# Patient Record
Sex: Male | Born: 1950 | Race: White | Hispanic: No | Marital: Married | Smoking: Never smoker
Health system: Southern US, Community
[De-identification: ages and names within clinical notes are randomized; demographics above are authoritative.]

## PROBLEM LIST (undated history)

## (undated) DIAGNOSIS — M1990 Unspecified osteoarthritis, unspecified site: Secondary | ICD-10-CM

## (undated) DIAGNOSIS — I10 Essential (primary) hypertension: Secondary | ICD-10-CM

## (undated) DIAGNOSIS — K219 Gastro-esophageal reflux disease without esophagitis: Secondary | ICD-10-CM

## (undated) DIAGNOSIS — F329 Major depressive disorder, single episode, unspecified: Secondary | ICD-10-CM

## (undated) DIAGNOSIS — F32A Depression, unspecified: Secondary | ICD-10-CM

## (undated) DIAGNOSIS — F419 Anxiety disorder, unspecified: Secondary | ICD-10-CM

## (undated) DIAGNOSIS — T4145XA Adverse effect of unspecified anesthetic, initial encounter: Secondary | ICD-10-CM

## (undated) DIAGNOSIS — Z8489 Family history of other specified conditions: Secondary | ICD-10-CM

## (undated) DIAGNOSIS — J189 Pneumonia, unspecified organism: Secondary | ICD-10-CM

## (undated) DIAGNOSIS — T8859XA Other complications of anesthesia, initial encounter: Secondary | ICD-10-CM

## (undated) DIAGNOSIS — E119 Type 2 diabetes mellitus without complications: Secondary | ICD-10-CM

## (undated) HISTORY — PX: JOINT REPLACEMENT: SHX530

## (undated) HISTORY — PX: ROTATOR CUFF REPAIR: SHX139

## (undated) HISTORY — PX: HERNIA REPAIR: SHX51

---

## 2018-01-15 NOTE — Patient Instructions (Signed)
Javan Gonzaga  01/15/2018   Your procedure is scheduled on: Tuesday 01/23/2018  Report to [facility] Main  Entrance              Report to admitting at  0735  AM    Call this number if you have problems the morning of surgery [phone]     Remember: Do not eat food or drink liquids :After Midnight.  How to Manage Your Diabetes Before and After Surgery  Why is it important to control my blood sugar before and after surgery? . Improving blood sugar levels before and after surgery helps healing and can limit problems. . A way of improving blood sugar control is eating a healthy diet by: o  Eating less sugar and carbohydrates o  Increasing activity/exercise o  Talking with your doctor about reaching your blood sugar goals . High blood sugars (greater than 180 mg/dL) can raise your risk of infections and slow your recovery, so you will need to focus on controlling your diabetes during the weeks before surgery. . Make sure that the doctor who takes care of your diabetes knows about your planned surgery including the date and location.  How do I manage my blood sugar before surgery? . Check your blood sugar at least 4 times a day, starting 2 days before surgery, to make sure that the level is not too high or low. o Check your blood sugar the morning of your surgery when you wake up and every 2 hours until you get to the Short Stay unit. . If your blood sugar is less than 70 mg/dL, you will need to treat for low blood sugar: o Do not take insulin. o Treat a low blood sugar (less than 70 mg/dL) with  cup of clear juice (cranberry or apple), 4 glucose tablets, OR glucose gel. o Recheck blood sugar in 15 minutes after treatment (to make sure it is greater than 70 mg/dL). If your blood sugar is not greater than 70 mg/dL on recheck, call [phone] for further instructions. . Report your blood sugar to the short stay nurse when you get to Short Stay.  . If you are  admitted to the hospital after surgery: o Your blood sugar will be checked by the staff and you will probably be given insulin after surgery (instead of oral diabetes medicines) to make sure you have good blood sugar levels. o The goal for blood sugar control after surgery is 80-180 mg/dL.   WHAT DO I DO ABOUT MY DIABETES MEDICATION?       The day before surgery, take the Glimepiride (Amaryl) morning dose only!  . Do not take oral diabetes medicines (pills) the morning of surgery.     Take these medicines the morning of surgery with A SIP OF WATER: Citalopram (Celexa)   DO NOT TAKE ANY DIABETIC MEDICATIONS DAY OF YOUR SURGERY                               You may not have any metal on your body including hair pins and              piercings  Do not wear jewelry, make-up, lotions, powders or perfumes, deodorant                        Men  may shave face and neck.   Do not bring valuables to the hospital. [facility] IS NOT             RESPONSIBLE   FOR VALUABLES.  Contacts, dentures or bridgework may not be worn into surgery.  Leave suitcase in the car. After surgery it may be brought to your room.                  Please read over the following fact sheets you were given: _____________________________________________________________________             [facility] - Preparing for Surgery Before surgery, you can play an important role.  Because skin is not sterile, your skin needs to be as free of germs as possible.  You can reduce the number of germs on your skin by washing with CHG (chlorahexidine gluconate) soap before surgery.  CHG is an antiseptic cleaner which kills germs and bonds with the skin to continue killing germs even after washing. Please DO NOT use if you have an allergy to CHG or antibacterial soaps.  If your skin becomes reddened/irritated stop using the CHG and inform your nurse when you arrive at Short Stay. Do not shave (including legs and underarms) for at  least 48 hours prior to the first CHG shower.  You may shave your face/neck. Please follow these instructions carefully:  1.  Shower with CHG Soap the night before surgery and the  morning of Surgery.  2.  If you choose to wash your hair, wash your hair first as usual with your  normal  shampoo.  3.  After you shampoo, rinse your hair and body thoroughly to remove the  shampoo.                           4.  Use CHG as you would any other liquid soap.  You can apply chg directly  to the skin and wash                       Gently with a scrungie or clean washcloth.  5.  Apply the CHG Soap to your body ONLY FROM THE NECK DOWN.   Do not use on face/ open                           Wound or open sores. Avoid contact with eyes, ears mouth and genitals (private parts).                       Wash face,  Genitals (private parts) with your normal soap.             6.  Wash thoroughly, paying special attention to the area where your surgery  will be performed.  7.  Thoroughly rinse your body with warm water from the neck down.  8.  DO NOT shower/wash with your normal soap after using and rinsing off  the CHG Soap.                9.  Pat yourself dry with a clean towel.            10.  Wear clean pajamas.            11.  Place clean sheets on your bed the night of your first shower and do not  sleep with pets.  Day of Surgery : Do not apply any lotions/deodorants the morning of surgery.  Please wear clean clothes to the hospital/surgery center.  FAILURE TO FOLLOW THESE INSTRUCTIONS MAY RESULT IN THE CANCELLATION OF YOUR SURGERY PATIENT SIGNATURE_________________________________  NURSE SIGNATURE__________________________________  ________________________________________________________________________   Rogelia Mire  An incentive spirometer is a tool that can help keep your lungs clear and active. This tool measures how well you are filling your lungs with each breath. Taking long deep breaths  may help reverse or decrease the chance of developing breathing (pulmonary) problems (especially infection) following:  A long period of time when you are unable to move or be active. BEFORE THE PROCEDURE   If the spirometer includes an indicator to show your best effort, your nurse or respiratory therapist will set it to a desired goal.  If possible, sit up straight or lean slightly forward. Try not to slouch.  Hold the incentive spirometer in an upright position. INSTRUCTIONS FOR USE  1. Sit on the edge of your bed if possible, or sit up as far as you can in bed or on a chair. 2. Hold the incentive spirometer in an upright position. 3. Breathe out normally. 4. Place the mouthpiece in your mouth and seal your lips tightly around it. 5. Breathe in slowly and as deeply as possible, raising the piston or the ball toward the top of the column. 6. Hold your breath for 3-5 seconds or for as long as possible. Allow the piston or ball to fall to the bottom of the column. 7. Remove the mouthpiece from your mouth and breathe out normally. 8. Rest for a few seconds and repeat Steps 1 through 7 at least 10 times every 1-2 hours when you are awake. Take your time and take a few normal breaths between deep breaths. 9. The spirometer may include an indicator to show your best effort. Use the indicator as a goal to work toward during each repetition. 10. After each set of 10 deep breaths, practice coughing to be sure your lungs are clear. If you have an incision (the cut made at the time of surgery), support your incision when coughing by placing a pillow or rolled up towels firmly against it. Once you are able to get out of bed, walk around indoors and cough well. You may stop using the incentive spirometer when instructed by your caregiver.  RISKS AND COMPLICATIONS  Take your time so you do not get dizzy or light-headed.  If you are in pain, you may need to take or ask for pain medication before doing  incentive spirometry. It is harder to take a deep breath if you are having pain. AFTER USE  Rest and breathe slowly and easily.  It can be helpful to keep track of a log of your progress. Your caregiver can provide you with a simple table to help with this. If you are using the spirometer at home, follow these instructions: SEEK MEDICAL CARE IF:   You are having difficultly using the spirometer.  You have trouble using the spirometer as often as instructed.  Your pain medication is not giving enough relief while using the spirometer.  You develop fever of 100.5 F (38.1 C) or higher. SEEK IMMEDIATE MEDICAL CARE IF:   You cough up bloody sputum that had not been present before.  You develop fever of 102 F (38.9 C) or greater.  You develop worsening pain at or near the incision site. MAKE SURE YOU:   Understand these instructions.  Will watch your condition.  Will get help right away if you are not doing well or get worse. Document Released: 01/09/2007 Document Revised: 11/21/2011 Document Reviewed: 03/12/2007 [employer] Patient Information 2014 [employer], [place].   ________________________________________________________________________  WHAT IS A BLOOD TRANSFUSION? Blood Transfusion Information  A transfusion is the replacement of blood or some of its parts. Blood is made up of multiple cells which provide different functions.  Red blood cells carry oxygen and are used for blood loss replacement.  White blood cells fight against infection.  Platelets control bleeding.  Plasma helps clot blood.  Other blood products are available for specialized needs, such as hemophilia or other clotting disorders. BEFORE THE TRANSFUSION  Who gives blood for transfusions?   Healthy volunteers who are fully evaluated to make sure their blood is safe. This is blood bank blood. Transfusion therapy is the safest it has ever been in the practice of medicine. Before blood is taken from a  donor, a complete history is taken to make sure that person has no history of diseases nor engages in risky social behavior (examples are intravenous drug use or sexual activity with multiple partners). The donor's travel history is screened to minimize risk of transmitting infections, such as malaria. The donated blood is tested for signs of infectious diseases, such as HIV and hepatitis. The blood is then tested to be sure it is compatible with you in order to minimize the chance of a transfusion reaction. If you or a relative donates blood, this is often done in anticipation of surgery and is not appropriate for emergency situations. It takes many days to process the donated blood. RISKS AND COMPLICATIONS Although transfusion therapy is very safe and saves many lives, the main dangers of transfusion include:   Getting an infectious disease.  Developing a transfusion reaction. This is an allergic reaction to something in the blood you were given. Every precaution is taken to prevent this. The decision to have a blood transfusion has been considered carefully by your caregiver before blood is given. Blood is not given unless the benefits outweigh the risks. AFTER THE TRANSFUSION  Right after receiving a blood transfusion, you will usually feel much better and more energetic. This is especially true if your red blood cells have gotten low (anemic). The transfusion raises the level of the red blood cells which carry oxygen, and this usually causes an energy increase.  The nurse administering the transfusion will monitor you carefully for complications. HOME CARE INSTRUCTIONS  No special instructions are needed after a transfusion. You may find your energy is better. Speak with your caregiver about any limitations on activity for underlying diseases you may have. SEEK MEDICAL CARE IF:   Your condition is not improving after your transfusion.  You develop redness or irritation at the intravenous (IV)  site. SEEK IMMEDIATE MEDICAL CARE IF:  Any of the following symptoms occur over the next 12 hours:  Shaking chills.  You have a temperature by mouth above 102 F (38.9 C), not controlled by medicine.  Chest, back, or muscle pain.  People around you feel you are not acting correctly or are confused.  Shortness of breath or difficulty breathing.  Dizziness and fainting.  You get a rash or develop hives.  You have a decrease in urine output.  Your urine turns a dark color or changes to pink, red, or brown. Any of the following symptoms occur over the next 10 days:  You have a temperature by mouth above 102  F (38.9 C), not controlled by medicine.  Shortness of breath.  Weakness after normal activity.  The white part of the eye turns yellow (jaundice).  You have a decrease in the amount of urine or are urinating less often.  Your urine turns a dark color or changes to pink, red, or brown. Document Released: 08/26/2000 Document Revised: 11/21/2011 Document Reviewed: 04/14/2008 [facility] Patient Information 2014 [place], [place].  _______________________________________________________________________

## 2018-01-17 ENCOUNTER — Inpatient Hospital Stay (HOSPITAL_COMMUNITY)
Admission: RE | Admit: 2018-01-17 | Discharge: 2018-01-17 | Disposition: A | Discharge status: Home / Self Care | Payer: Self-pay | Source: Ambulatory Visit

## 2018-01-23 ENCOUNTER — Encounter (HOSPITAL_COMMUNITY): Admission: RE | Payer: Self-pay | Source: Ambulatory Visit

## 2018-01-23 ENCOUNTER — Inpatient Hospital Stay (HOSPITAL_COMMUNITY): Admission: RE | Admit: 2018-01-23 | Payer: Medicare Other | Source: Ambulatory Visit | Admitting: Orthopedic Surgery

## 2018-01-23 SURGERY — ARTHROPLASTY, HIP, TOTAL, ANTERIOR APPROACH
Anesthesia: Spinal | Site: Hip | Laterality: Right

## 2018-04-06 NOTE — H&P (Signed)
TOTAL HIP ADMISSION H&P  Patient is admitted for right total hip arthroplasty, anterior approach.  Subjective:  Chief Complaint:    Right hip primary OA / pain  HPI: Jose Yang, 67 y.o. male, has a history of pain and functional disability in the right hip(s) due to arthritis and patient has failed non-surgical conservative treatments for greater than 12 weeks to include NSAID's and/or analgesics, corticosteriod injections and activity modification.  Onset of symptoms was gradual starting 3 years ago with gradually worsening course since that time.The patient noted no past surgery on the right hip(s).  Patient currently rates pain in the right hip at 10 out of 10 with activity. Patient has night pain, worsening of pain with activity and weight bearing, trendelenberg gait, pain that interfers with activities of daily living and pain with passive range of motion. Patient has evidence of periarticular osteophytes and joint space narrowing by imaging studies. This condition presents safety issues increasing the risk of falls.  There is no current active infection.  Risks, benefits and expectations were discussed with the patient.  Risks including but not limited to the risk of anesthesia, blood clots, nerve damage, blood vessel damage, failure of the prosthesis, infection and up to and including death.  Patient understand the risks, benefits and expectations and wishes to proceed with surgery.   PCP: System, Pcp Not In  D/C Plans:       Home  Post-op Meds:       No Rx given  Tranexamic Acid:      To be given - IV  Decadron:      Is to be given  FYI:      ASA  [employer]co  DME:   Pt already has equipment   PT:   No PT     Past Medical History:  Diagnosis Date  . Anxiety   . Arthritis   . Complication of anesthesia    confusion for a couple weeks after anesthesia  . Depression   . Diabetes mellitus without complication (HCC)    type 2  . Family history of adverse reaction to anesthesia    . GERD (gastroesophageal reflux disease)   . Hypertension   . Pneumonia    as a   small child     Past Surgical History:  Procedure Laterality Date  . HERNIA REPAIR     bil groin in 1970's  . JOINT REPLACEMENT     04-17-18 Dr Charlann Boxerlin right hip  . ROTATOR CUFF REPAIR     right    No current facility-administered medications for this encounter.    Current Outpatient Medications  Medication Sig Dispense Refill Last Dose  . benazepril (LOTENSIN) 40 MG tablet Take 40 mg by mouth daily with breakfast.     . citalopram (CELEXA) 40 MG tablet Take 40 mg by mouth daily with breakfast.     . dexlansoprazole (DEXILANT) 60 MG capsule Take 60 mg by mouth daily before breakfast.     . glimepiride (AMARYL) 4 MG tablet Take 2 mg by mouth 2 (two) times daily. Breakfast & supper     . ibuprofen (ADVIL,MOTRIN) 200 MG tablet Take 800 mg by mouth every 8 (eight) hours as needed (for pain.).     Marland Kitchen. metFORMIN (GLUCOPHAGE) 1000 MG tablet Take 1,000 mg by mouth 2 (two) times daily.     . rosuvastatin (CRESTOR) 40 MG tablet Take 40 mg by mouth at bedtime.     . vitamin B-12 (CYANOCOBALAMIN) 1000 MCG tablet  Take 1,000 mcg by mouth 2 (two) times a week.      No Known Allergies   Social History   Tobacco Use  . Smoking status: Never Smoker  . Smokeless tobacco: Never Used  Substance Use Topics  . Alcohol use: Not Currently    Frequency: Never       Review of Systems  Constitutional: Negative.   HENT: Negative.   Eyes: Negative.   Respiratory: Positive for shortness of breath (with exertion).   Cardiovascular: Negative.   Gastrointestinal: Positive for diarrhea, heartburn and nausea.  Genitourinary: Positive for frequency.  Musculoskeletal: Positive for back pain and joint pain.  Skin: Negative.   Neurological: Positive for tremors and headaches.  Endo/Heme/Allergies: Negative.   Psychiatric/Behavioral: The patient is nervous/anxious.     Objective:  Physical Exam  Constitutional: He is  oriented to person, place, and time. He appears well-developed.  HENT:  Head: Normocephalic.  Eyes: Pupils are equal, round, and reactive to light.  Neck: Neck supple. No JVD present. No tracheal deviation present. No thyromegaly present.  Cardiovascular: Normal rate, regular rhythm and intact distal pulses.  Respiratory: Effort normal and breath sounds normal. No respiratory distress. He has no wheezes.  GI: Soft. There is no tenderness. There is no guarding.  Musculoskeletal:       Right hip: He exhibits decreased range of motion, decreased strength, tenderness and bony tenderness. He exhibits no swelling, no deformity and no laceration.  Lymphadenopathy:    He has no cervical adenopathy.  Neurological: He is alert and oriented to person, place, and time.  Skin: Skin is warm and dry.  Psychiatric: He has a normal mood and affect.       Imaging Review Plain radiographs demonstrate severe degenerative joint disease of the right hip. The bone quality appears to be good for age and reported activity level.    Preoperative templating of the joint replacement has been completed, documented, and submitted to the Operating Room personnel in order to optimize intra-operative equipment management.     Assessment/Plan:  End stage arthritis, right hip  The patient history, physical examination, clinical judgement of the provider and imaging studies are consistent with end stage degenerative joint disease of the right hip and total hip arthroplasty is deemed medically necessary. The treatment options including medical management, injection therapy, arthroscopy and arthroplasty were discussed at length. The risks and benefits of total hip arthroplasty were presented and reviewed. The risks due to aseptic loosening, infection, stiffness, dislocation/subluxation,  thromboembolic complications and other imponderables were discussed.  The patient acknowledged the explanation, agreed to proceed with  the plan and consent was signed. Patient is being admitted for inpatient treatment for surgery, pain control, PT, OT, prophylactic antibiotics, VTE prophylaxis, progressive ambulation and ADL's and discharge planning.The patient is planning to be discharged home.    Anastasio Auerbach Robi Mitter   PA-C  04/13/2018, 8:56 AM

## 2018-04-10 ENCOUNTER — Other Ambulatory Visit (HOSPITAL_COMMUNITY): Payer: Self-pay | Admitting: *Deleted

## 2018-04-10 NOTE — Progress Notes (Signed)
MEDICAL CLEARANCE NOTE CASEY SHARP FNP  04-06-18 ON CHART FOR 04-17-18 SURGERY CBC WITH DIF, CMET 04-05-18 [facility] [place] ON CHART HEMAGLOBIN A1C 03-08-18 [facility] [place] ON CHART LOV CASEY SHARP FNO [facility] [place] 03-08-18 ON CHART EKG 04-05-18 [facility] [place] ON CHART

## 2018-04-10 NOTE — Patient Instructions (Addendum)
Jose Yang  04/10/2018   Your procedure is scheduled on: 04-17-18  Report to [facility] Main  Entrance  Report to admitting at 900 AM    Call this number if you have problems the morning of surgery [phone]   Remember: Do not eat food or drink liquids :After Midnight.                 TAKE ONLY YOUR MORNING DOSE OF GLIMPERIDE DAY BEFORE SURGERY 04-16-18               TAKE YOUR METFORMIN AS USUAL DAY BEFORE SURGERY 04-16-18               DO NOT TAKE ANY DIABETIC MEDICATIONS DAY OF SURGERY 04-17-18  Take these medicines the morning of surgery with A SIP OF WATER: CITALOPRAM (CELEXA), DEXILANT                               You may not have any metal on your body including hair pins and              piercings  Do not wear jewelry,  lotions, powders or perfumes, deodorant                    Men may shave face and neck.   Do not bring valuables to the hospital. [facility] IS NOT             RESPONSIBLE   FOR VALUABLES.  Contacts, dentures or bridgework may not be worn into surgery.  Leave suitcase in the car. After surgery it may be brought to your room.               Please read over the following fact sheets you were given: _____________________________________________________________________           [facility] - Preparing for Surgery Before surgery, you can play an important role.  Because skin is not sterile, your skin needs to be as free of germs as possible.  You can reduce the number of germs on your skin by washing with CHG (chlorahexidine gluconate) soap before surgery.  CHG is an antiseptic cleaner which kills germs and bonds with the skin to continue killing germs even after washing. Please DO NOT use if you have an allergy to CHG or antibacterial soaps.  If your skin becomes reddened/irritated stop using the CHG and inform your nurse when you arrive at Short Stay. Do not shave (including legs and underarms) for at least 48 hours prior to the first CHG  shower.  You may shave your face/neck. Please follow these instructions carefully:  1.  Shower with CHG Soap the night before surgery and the  morning of Surgery.  2.  If you choose to wash your hair, wash your hair first as usual with your  normal  shampoo.  3.  After you shampoo, rinse your hair and body thoroughly to remove the  shampoo.                           4.  Use CHG as you would any other liquid soap.  You can apply chg directly  to the skin and wash  Gently with a scrungie or clean washcloth.  5.  Apply the CHG Soap to your body ONLY FROM THE NECK DOWN.   Do not use on face/ open                           Wound or open sores. Avoid contact with eyes, ears mouth and genitals (private parts).                       Wash face,  Genitals (private parts) with your normal soap.             6.  Wash thoroughly, paying special attention to the area where your surgery  will be performed.  7.  Thoroughly rinse your body with warm water from the neck down.  8.  DO NOT shower/wash with your normal soap after using and rinsing off  the CHG Soap.                9.  Pat yourself dry with a clean towel.            10.  Wear clean pajamas.            11.  Place clean sheets on your bed the night of your first shower and do not  sleep with pets. Day of Surgery : Do not apply any lotions/deodorants the morning of surgery.  Please wear clean clothes to the hospital/surgery center.  FAILURE TO FOLLOW THESE INSTRUCTIONS MAY RESULT IN THE CANCELLATION OF YOUR SURGERY PATIENT SIGNATURE_________________________________  NURSE SIGNATURE__________________________________  ________________________________________________________________________   Jose Yang  An incentive spirometer is a tool that can help keep your lungs clear and active. This tool measures how well you are filling your lungs with each breath. Taking long deep breaths may help reverse or decrease the chance  of developing breathing (pulmonary) problems (especially infection) following:  A long period of time when you are unable to move or be active. BEFORE THE PROCEDURE   If the spirometer includes an indicator to show your best effort, your nurse or respiratory therapist will set it to a desired goal.  If possible, sit up straight or lean slightly forward. Try not to slouch.  Hold the incentive spirometer in an upright position. INSTRUCTIONS FOR USE  1. Sit on the edge of your bed if possible, or sit up as far as you can in bed or on a chair. 2. Hold the incentive spirometer in an upright position. 3. Breathe out normally. 4. Place the mouthpiece in your mouth and seal your lips tightly around it. 5. Breathe in slowly and as deeply as possible, raising the piston or the ball toward the top of the column. 6. Hold your breath for 3-5 seconds or for as long as possible. Allow the piston or ball to fall to the bottom of the column. 7. Remove the mouthpiece from your mouth and breathe out normally. 8. Rest for a few seconds and repeat Steps 1 through 7 at least 10 times every 1-2 hours when you are awake. Take your time and take a few normal breaths between deep breaths. 9. The spirometer may include an indicator to show your best effort. Use the indicator as a goal to work toward during each repetition. 10. After each set of 10 deep breaths, practice coughing to be sure your lungs are clear. If you have an incision (the cut made at the time of surgery),  support your incision when coughing by placing a pillow or rolled up towels firmly against it. Once you are able to get out of bed, walk around indoors and cough well. You may stop using the incentive spirometer when instructed by your caregiver.  RISKS AND COMPLICATIONS  Take your time so you do not get dizzy or light-headed.  If you are in pain, you may need to take or ask for pain medication before doing incentive spirometry. It is harder to  take a deep breath if you are having pain. AFTER USE  Rest and breathe slowly and easily.  It can be helpful to keep track of a log of your progress. Your caregiver can provide you with a simple table to help with this. If you are using the spirometer at home, follow these instructions: [place] IF:   You are having difficultly using the spirometer.  You have trouble using the spirometer as often as instructed.  Your pain medication is not giving enough relief while using the spirometer.  You develop fever of 100.5 F (38.1 C) or higher. SEEK IMMEDIATE MEDICAL CARE IF:   You cough up bloody sputum that had not been present before.  You develop fever of 102 F (38.9 C) or greater.  You develop worsening pain at or near the incision site. MAKE SURE YOU:   Understand these instructions.  Will watch your condition.  Will get help right away if you are not doing well or get worse. Document Released: 01/09/2007 Document Revised: 11/21/2011 Document Reviewed: 03/12/2007 ExitCare Patient Information 2014 ExitCare, [place].   ________________________________________________________________________  WHAT IS A BLOOD TRANSFUSION? Blood Transfusion Information  A transfusion is the replacement of blood or some of its parts. Blood is made up of multiple cells which provide different functions.  Red blood cells carry oxygen and are used for blood loss replacement.  White blood cells fight against infection.  Platelets control bleeding.  Plasma helps clot blood.  Other blood products are available for specialized needs, such as hemophilia or other clotting disorders. BEFORE THE TRANSFUSION  Who gives blood for transfusions?   Healthy volunteers who are fully evaluated to make sure their blood is safe. This is blood bank blood. Transfusion therapy is the safest it has ever been in the practice of medicine. Before blood is taken from a donor, a complete history is taken to  make sure that person has no history of diseases nor engages in risky social behavior (examples are intravenous drug use or sexual activity with multiple partners). The donor's travel history is screened to minimize risk of transmitting infections, such as malaria. The donated blood is tested for signs of infectious diseases, such as HIV and hepatitis. The blood is then tested to be sure it is compatible with you in order to minimize the chance of a transfusion reaction. If you or a relative donates blood, this is often done in anticipation of surgery and is not appropriate for emergency situations. It takes many days to process the donated blood. RISKS AND COMPLICATIONS Although transfusion therapy is very safe and saves many lives, the main dangers of transfusion include:   Getting an infectious disease.  Developing a transfusion reaction. This is an allergic reaction to something in the blood you were given. Every precaution is taken to prevent this. The decision to have a blood transfusion has been considered carefully by your caregiver before blood is given. Blood is not given unless the benefits outweigh the risks. AFTER THE TRANSFUSION  Right after receiving a blood transfusion, you will usually feel much better and more energetic. This is especially true if your red blood cells have gotten low (anemic). The transfusion raises the level of the red blood cells which carry oxygen, and this usually causes an energy increase.  The nurse administering the transfusion will monitor you carefully for complications. HOME CARE INSTRUCTIONS  No special instructions are needed after a transfusion. You may find your energy is better. Speak with your caregiver about any limitations on activity for underlying diseases you may have. SEEK MEDICAL CARE IF:   Your condition is not improving after your transfusion.  You develop redness or irritation at the intravenous (IV) site. SEEK IMMEDIATE MEDICAL CARE  IF:  Any of the following symptoms occur over the next 12 hours:  Shaking chills.  You have a temperature by mouth above 102 F (38.9 C), not controlled by medicine.  Chest, back, or muscle pain.  People around you feel you are not acting correctly or are confused.  Shortness of breath or difficulty breathing.  Dizziness and fainting.  You get a rash or develop hives.  You have a decrease in urine output.  Your urine turns a dark color or changes to pink, red, or brown. Any of the following symptoms occur over the next 10 days:  You have a temperature by mouth above 102 F (38.9 C), not controlled by medicine.  Shortness of breath.  Weakness after normal activity.  The white part of the eye turns yellow (jaundice).  You have a decrease in the amount of urine or are urinating less often.  Your urine turns a dark color or changes to pink, red, or brown. Document Released: 08/26/2000 Document Revised: 11/21/2011 Document Reviewed: 04/14/2008 [facility] Patient Information 2014 [place], [place].  _______________________________________________________________________

## 2018-04-11 ENCOUNTER — Encounter (HOSPITAL_COMMUNITY): Payer: Self-pay

## 2018-04-11 ENCOUNTER — Other Ambulatory Visit: Payer: Self-pay

## 2018-04-11 ENCOUNTER — Encounter (HOSPITAL_COMMUNITY)
Admission: RE | Admit: 2018-04-11 | Discharge: 2018-04-11 | Disposition: A | Discharge status: Home / Self Care | Payer: Medicare Other | Source: Ambulatory Visit | Attending: Orthopedic Surgery | Admitting: Orthopedic Surgery

## 2018-04-11 DIAGNOSIS — Z01812 Encounter for preprocedural laboratory examination: Secondary | ICD-10-CM | POA: Insufficient documentation

## 2018-04-11 DIAGNOSIS — M1611 Unilateral primary osteoarthritis, right hip: Secondary | ICD-10-CM | POA: Diagnosis not present

## 2018-04-11 DIAGNOSIS — M25551 Pain in right hip: Secondary | ICD-10-CM | POA: Insufficient documentation

## 2018-04-11 HISTORY — DX: Major depressive disorder, single episode, unspecified: F32.9

## 2018-04-11 HISTORY — DX: Type 2 diabetes mellitus without complications: E11.9

## 2018-04-11 HISTORY — DX: Essential (primary) hypertension: I10

## 2018-04-11 HISTORY — DX: Gastro-esophageal reflux disease without esophagitis: K21.9

## 2018-04-11 HISTORY — DX: Unspecified osteoarthritis, unspecified site: M19.90

## 2018-04-11 HISTORY — DX: Other complications of anesthesia, initial encounter: T88.59XA

## 2018-04-11 HISTORY — DX: Pneumonia, unspecified organism: J18.9

## 2018-04-11 HISTORY — DX: Family history of other specified conditions: Z84.89

## 2018-04-11 HISTORY — DX: Depression, unspecified: F32.A

## 2018-04-11 HISTORY — DX: Anxiety disorder, unspecified: F41.9

## 2018-04-11 HISTORY — DX: Adverse effect of unspecified anesthetic, initial encounter: T41.45XA

## 2018-04-11 LAB — SURGICAL PCR SCREEN
MRSA, PCR: NEGATIVE
Staphylococcus aureus: POSITIVE — AB

## 2018-04-11 LAB — ABO/RH: ABO/RH(D): A POS

## 2018-04-12 ENCOUNTER — Other Ambulatory Visit: Payer: Self-pay | Admitting: Orthopedic Surgery

## 2018-04-12 NOTE — Care Plan (Signed)
R THA scheduled on 04-17-18 DCP:  Home with spouse.  1 story home with 1 ste. DME:  No needs.  Has a RW and elevated toilets. PT:  HEP

## 2018-04-17 ENCOUNTER — Other Ambulatory Visit: Payer: Self-pay

## 2018-04-17 ENCOUNTER — Inpatient Hospital Stay (HOSPITAL_COMMUNITY): Payer: Medicare Other | Admitting: Certified Registered"

## 2018-04-17 ENCOUNTER — Encounter (HOSPITAL_COMMUNITY)
Admission: RE | Disposition: A | Discharge status: Home / Self Care | Payer: Self-pay | Source: Ambulatory Visit | Attending: Orthopedic Surgery

## 2018-04-17 ENCOUNTER — Encounter (HOSPITAL_COMMUNITY): Payer: Self-pay | Admitting: *Deleted

## 2018-04-17 ENCOUNTER — Inpatient Hospital Stay (HOSPITAL_COMMUNITY)
Admission: RE | Admit: 2018-04-17 | Discharge: 2018-04-18 | DRG: 470 | Disposition: A | Discharge status: Home / Self Care | Payer: Medicare Other | Source: Ambulatory Visit | Attending: Orthopedic Surgery | Admitting: Orthopedic Surgery

## 2018-04-17 ENCOUNTER — Inpatient Hospital Stay (HOSPITAL_COMMUNITY): Payer: Medicare Other

## 2018-04-17 DIAGNOSIS — F419 Anxiety disorder, unspecified: Secondary | ICD-10-CM | POA: Diagnosis present

## 2018-04-17 DIAGNOSIS — M1611 Unilateral primary osteoarthritis, right hip: Principal | ICD-10-CM | POA: Diagnosis present

## 2018-04-17 DIAGNOSIS — F329 Major depressive disorder, single episode, unspecified: Secondary | ICD-10-CM | POA: Diagnosis present

## 2018-04-17 DIAGNOSIS — Z6832 Body mass index (BMI) 32.0-32.9, adult: Secondary | ICD-10-CM

## 2018-04-17 DIAGNOSIS — I10 Essential (primary) hypertension: Secondary | ICD-10-CM | POA: Diagnosis present

## 2018-04-17 DIAGNOSIS — Z9181 History of falling: Secondary | ICD-10-CM | POA: Diagnosis not present

## 2018-04-17 DIAGNOSIS — E669 Obesity, unspecified: Secondary | ICD-10-CM | POA: Diagnosis present

## 2018-04-17 DIAGNOSIS — Z7984 Long term (current) use of oral hypoglycemic drugs: Secondary | ICD-10-CM

## 2018-04-17 DIAGNOSIS — Z79899 Other long term (current) drug therapy: Secondary | ICD-10-CM | POA: Diagnosis not present

## 2018-04-17 DIAGNOSIS — E119 Type 2 diabetes mellitus without complications: Secondary | ICD-10-CM | POA: Diagnosis present

## 2018-04-17 DIAGNOSIS — Z96649 Presence of unspecified artificial hip joint: Secondary | ICD-10-CM

## 2018-04-17 HISTORY — PX: TOTAL HIP ARTHROPLASTY: SHX124

## 2018-04-17 LAB — GLUCOSE, CAPILLARY
GLUCOSE-CAPILLARY: 225 mg/dL — AB (ref 70–99)
Glucose-Capillary: 196 mg/dL — ABNORMAL HIGH (ref 70–99)
Glucose-Capillary: 245 mg/dL — ABNORMAL HIGH (ref 70–99)
Glucose-Capillary: 203 mg/dL — ABNORMAL HIGH (ref 70–99)

## 2018-04-17 LAB — TYPE AND SCREEN
ABO/RH(D): A POS
Antibody Screen: NEGATIVE

## 2018-04-17 SURGERY — ARTHROPLASTY, HIP, TOTAL, ANTERIOR APPROACH
Anesthesia: Spinal | Site: Hip | Laterality: Right

## 2018-04-17 MED ORDER — DIPHENHYDRAMINE HCL 12.5 MG/5ML PO ELIX
12.5000 mg | ORAL_SOLUTION | ORAL | Status: DC | PRN
  Administered 2018-04-17: 25 mg via ORAL
  Filled 2018-04-17: qty 10

## 2018-04-17 MED ORDER — ALUM & MAG HYDROXIDE-SIMETH 200-200-20 MG/5ML PO SUSP: 15.0000 mL | ORAL | Status: DC | PRN

## 2018-04-17 MED ORDER — OXYCODONE HCL 5 MG PO TABS: 5.0000 mg | ORAL_TABLET | Freq: Once | ORAL | Status: DC | PRN

## 2018-04-17 MED ORDER — CHLORHEXIDINE GLUCONATE 4 % EX LIQD: 60.0000 mL | Freq: Once | CUTANEOUS | Status: DC

## 2018-04-17 MED ORDER — MAGNESIUM CITRATE PO SOLN: 1.0000 | Freq: Once | ORAL | Status: DC | PRN

## 2018-04-17 MED ORDER — ROSUVASTATIN CALCIUM 20 MG PO TABS
40.0000 mg | ORAL_TABLET | Freq: Every day | ORAL | Status: DC
  Administered 2018-04-17: 40 mg via ORAL
  Filled 2018-04-17: qty 2

## 2018-04-17 MED ORDER — SODIUM CHLORIDE 0.9 % IR SOLN
Status: DC | PRN
  Administered 2018-04-17: 1000 mL

## 2018-04-17 MED ORDER — CEFAZOLIN SODIUM-DEXTROSE 2-4 GM/100ML-% IV SOLN
2.0000 g | INTRAVENOUS | Status: AC
  Administered 2018-04-17: 2 g via INTRAVENOUS
  Filled 2018-04-17: qty 100

## 2018-04-17 MED ORDER — BUPIVACAINE IN DEXTROSE 0.75-8.25 % IT SOLN
INTRATHECAL | Status: DC | PRN
  Administered 2018-04-17: 2 mL via INTRATHECAL

## 2018-04-17 MED ORDER — TRANEXAMIC ACID 1000 MG/10ML IV SOLN
1000.0000 mg | INTRAVENOUS | Status: AC
  Administered 2018-04-17: 1000 mg via INTRAVENOUS
  Filled 2018-04-17: qty 10

## 2018-04-17 MED ORDER — FERROUS SULFATE 325 (65 FE) MG PO TABS
325.0000 mg | ORAL_TABLET | Freq: Three times a day (TID) | ORAL | Status: DC
  Administered 2018-04-18 (×2): 325 mg via ORAL
  Filled 2018-04-17 (×2): qty 1

## 2018-04-17 MED ORDER — DOCUSATE SODIUM 100 MG PO CAPS: 100.0000 mg | ORAL_CAPSULE | Freq: Two times a day (BID) | ORAL | 0 refills | Status: AC

## 2018-04-17 MED ORDER — MIDAZOLAM HCL 2 MG/2ML IJ SOLN
INTRAMUSCULAR | Status: DC | PRN
  Administered 2018-04-17: 2 mg via INTRAVENOUS

## 2018-04-17 MED ORDER — PANTOPRAZOLE SODIUM 40 MG PO TBEC
80.0000 mg | DELAYED_RELEASE_TABLET | Freq: Every day | ORAL | Status: DC
  Administered 2018-04-18: 80 mg via ORAL
  Filled 2018-04-17: qty 2

## 2018-04-17 MED ORDER — METHOCARBAMOL 500 MG IVPB - SIMPLE MED
500.0000 mg | Freq: Four times a day (QID) | INTRAVENOUS | Status: DC | PRN
  Administered 2018-04-17 (×2): 500 mg via INTRAVENOUS
  Filled 2018-04-17: qty 500

## 2018-04-17 MED ORDER — METOCLOPRAMIDE HCL 5 MG PO TABS: 5.0000 mg | ORAL_TABLET | Freq: Three times a day (TID) | ORAL | Status: DC | PRN

## 2018-04-17 MED ORDER — OXYCODONE HCL 5 MG/5ML PO SOLN
5.0000 mg | Freq: Once | ORAL | Status: DC | PRN
  Filled 2018-04-17: qty 5

## 2018-04-17 MED ORDER — DOCUSATE SODIUM 100 MG PO CAPS
100.0000 mg | ORAL_CAPSULE | Freq: Two times a day (BID) | ORAL | Status: DC
  Administered 2018-04-17 – 2018-04-18 (×2): 100 mg via ORAL
  Filled 2018-04-17 (×2): qty 1

## 2018-04-17 MED ORDER — CELECOXIB 200 MG PO CAPS
200.0000 mg | ORAL_CAPSULE | Freq: Two times a day (BID) | ORAL | Status: DC
  Administered 2018-04-17 – 2018-04-18 (×2): 200 mg via ORAL
  Filled 2018-04-17 (×2): qty 1

## 2018-04-17 MED ORDER — GLIMEPIRIDE 2 MG PO TABS
2.0000 mg | ORAL_TABLET | Freq: Two times a day (BID) | ORAL | Status: DC
  Administered 2018-04-17 – 2018-04-18 (×2): 2 mg via ORAL
  Filled 2018-04-17 (×3): qty 1

## 2018-04-17 MED ORDER — ASPIRIN 81 MG PO CHEW
81.0000 mg | CHEWABLE_TABLET | Freq: Two times a day (BID) | ORAL | Status: DC
  Administered 2018-04-17 – 2018-04-18 (×2): 81 mg via ORAL
  Filled 2018-04-17 (×2): qty 1

## 2018-04-17 MED ORDER — DEXAMETHASONE SODIUM PHOSPHATE 10 MG/ML IJ SOLN
10.0000 mg | Freq: Once | INTRAMUSCULAR | Status: AC
  Administered 2018-04-17: 10 mg via INTRAVENOUS

## 2018-04-17 MED ORDER — FENTANYL CITRATE (PF) 100 MCG/2ML IJ SOLN
INTRAMUSCULAR | Status: AC
  Filled 2018-04-17: qty 2

## 2018-04-17 MED ORDER — BISACODYL 10 MG RE SUPP: 10.0000 mg | Freq: Every day | RECTAL | Status: DC | PRN

## 2018-04-17 MED ORDER — METHOCARBAMOL 500 MG IVPB - SIMPLE MED
INTRAVENOUS | Status: AC
  Filled 2018-04-17: qty 50

## 2018-04-17 MED ORDER — HYDROCODONE-ACETAMINOPHEN 5-325 MG PO TABS
1.0000 | ORAL_TABLET | ORAL | Status: DC | PRN
  Administered 2018-04-17: 2 via ORAL
  Filled 2018-04-17: qty 2

## 2018-04-17 MED ORDER — ONDANSETRON HCL 4 MG PO TABS: 4.0000 mg | ORAL_TABLET | Freq: Four times a day (QID) | ORAL | Status: DC | PRN

## 2018-04-17 MED ORDER — PROPOFOL 10 MG/ML IV BOLUS
INTRAVENOUS | Status: AC
  Filled 2018-04-17: qty 80

## 2018-04-17 MED ORDER — HYDROCODONE-ACETAMINOPHEN 7.5-325 MG PO TABS
1.0000 | ORAL_TABLET | ORAL | Status: DC | PRN
  Administered 2018-04-17 – 2018-04-18 (×5): 2 via ORAL
  Filled 2018-04-17 (×5): qty 2

## 2018-04-17 MED ORDER — POLYETHYLENE GLYCOL 3350 17 G PO PACK: 17.0000 g | PACK | Freq: Two times a day (BID) | ORAL | 0 refills | Status: AC

## 2018-04-17 MED ORDER — ONDANSETRON HCL 4 MG/2ML IJ SOLN: 4.0000 mg | Freq: Once | INTRAMUSCULAR | Status: DC | PRN

## 2018-04-17 MED ORDER — INSULIN ASPART 100 UNIT/ML ~~LOC~~ SOLN
0.0000 [IU] | Freq: Three times a day (TID) | SUBCUTANEOUS | Status: DC
  Administered 2018-04-17: 5 [IU] via SUBCUTANEOUS
  Administered 2018-04-18: 3 [IU] via SUBCUTANEOUS
  Administered 2018-04-18: 8 [IU] via SUBCUTANEOUS

## 2018-04-17 MED ORDER — PHENYLEPHRINE 40 MCG/ML (10ML) SYRINGE FOR IV PUSH (FOR BLOOD PRESSURE SUPPORT)
PREFILLED_SYRINGE | INTRAVENOUS | Status: AC
  Filled 2018-04-17: qty 20

## 2018-04-17 MED ORDER — METOCLOPRAMIDE HCL 5 MG/ML IJ SOLN: 5.0000 mg | Freq: Three times a day (TID) | INTRAMUSCULAR | Status: DC | PRN

## 2018-04-17 MED ORDER — ONDANSETRON HCL 4 MG/2ML IJ SOLN: 4.0000 mg | Freq: Four times a day (QID) | INTRAMUSCULAR | Status: DC | PRN

## 2018-04-17 MED ORDER — CITALOPRAM HYDROBROMIDE 20 MG PO TABS
40.0000 mg | ORAL_TABLET | Freq: Every day | ORAL | Status: DC
  Administered 2018-04-18: 40 mg via ORAL
  Filled 2018-04-17: qty 2

## 2018-04-17 MED ORDER — METFORMIN HCL 500 MG PO TABS
1000.0000 mg | ORAL_TABLET | Freq: Two times a day (BID) | ORAL | Status: DC
Start: 2018-04-17 — End: 2018-04-18
  Administered 2018-04-17 – 2018-04-18 (×2): 1000 mg via ORAL
  Filled 2018-04-17 (×2): qty 2

## 2018-04-17 MED ORDER — ONDANSETRON HCL 4 MG/2ML IJ SOLN
INTRAMUSCULAR | Status: DC | PRN
  Administered 2018-04-17: 4 mg via INTRAVENOUS

## 2018-04-17 MED ORDER — HYDROCODONE-ACETAMINOPHEN 7.5-325 MG PO TABS: 1.0000 | ORAL_TABLET | ORAL | 0 refills | Status: AC | PRN

## 2018-04-17 MED ORDER — DEXAMETHASONE SODIUM PHOSPHATE 10 MG/ML IJ SOLN
10.0000 mg | Freq: Once | INTRAMUSCULAR | Status: AC
  Administered 2018-04-18: 10 mg via INTRAVENOUS
  Filled 2018-04-17: qty 1

## 2018-04-17 MED ORDER — MENTHOL 3 MG MT LOZG: 1.0000 | LOZENGE | OROMUCOSAL | Status: DC | PRN

## 2018-04-17 MED ORDER — ACETAMINOPHEN 325 MG PO TABS: 325.0000 mg | ORAL_TABLET | Freq: Four times a day (QID) | ORAL | Status: DC | PRN

## 2018-04-17 MED ORDER — POLYETHYLENE GLYCOL 3350 17 G PO PACK
17.0000 g | PACK | Freq: Two times a day (BID) | ORAL | Status: DC
  Administered 2018-04-17 – 2018-04-18 (×2): 17 g via ORAL
  Filled 2018-04-17 (×2): qty 1

## 2018-04-17 MED ORDER — LACTATED RINGERS IV SOLN
INTRAVENOUS | Status: DC
  Administered 2018-04-17 (×2): via INTRAVENOUS

## 2018-04-17 MED ORDER — ACETAMINOPHEN 325 MG PO TABS: 325.0000 mg | ORAL_TABLET | ORAL | Status: DC | PRN

## 2018-04-17 MED ORDER — FENTANYL CITRATE (PF) 100 MCG/2ML IJ SOLN: 25.0000 ug | INTRAMUSCULAR | Status: DC | PRN

## 2018-04-17 MED ORDER — PROPOFOL 500 MG/50ML IV EMUL
INTRAVENOUS | Status: DC | PRN
  Administered 2018-04-17: 100 ug/kg/min via INTRAVENOUS

## 2018-04-17 MED ORDER — FERROUS SULFATE 325 (65 FE) MG PO TABS: 325.0000 mg | ORAL_TABLET | Freq: Three times a day (TID) | ORAL | 3 refills | Status: AC

## 2018-04-17 MED ORDER — METHOCARBAMOL 500 MG PO TABS
500.0000 mg | ORAL_TABLET | Freq: Four times a day (QID) | ORAL | Status: DC | PRN
  Administered 2018-04-18: 500 mg via ORAL
  Filled 2018-04-17: qty 1

## 2018-04-17 MED ORDER — MORPHINE SULFATE (PF) 2 MG/ML IV SOLN: 0.5000 mg | INTRAVENOUS | Status: DC | PRN

## 2018-04-17 MED ORDER — PHENOL 1.4 % MT LIQD
1.0000 | OROMUCOSAL | Status: DC | PRN
  Filled 2018-04-17: qty 177

## 2018-04-17 MED ORDER — ASPIRIN 81 MG PO CHEW: 81.0000 mg | CHEWABLE_TABLET | Freq: Two times a day (BID) | ORAL | 0 refills | Status: AC

## 2018-04-17 MED ORDER — SODIUM CHLORIDE 0.9 % IV SOLN
INTRAVENOUS | Status: DC
Start: 2018-04-17 — End: 2018-04-18
  Administered 2018-04-17 (×2): via INTRAVENOUS

## 2018-04-17 MED ORDER — MIDAZOLAM HCL 2 MG/2ML IJ SOLN
INTRAMUSCULAR | Status: AC
  Filled 2018-04-17: qty 2

## 2018-04-17 MED ORDER — ACETAMINOPHEN 160 MG/5ML PO SOLN: 325.0000 mg | ORAL | Status: DC | PRN

## 2018-04-17 MED ORDER — TRANEXAMIC ACID 1000 MG/10ML IV SOLN
1000.0000 mg | Freq: Once | INTRAVENOUS | Status: AC
  Administered 2018-04-17: 1000 mg via INTRAVENOUS
  Filled 2018-04-17: qty 1000

## 2018-04-17 MED ORDER — METHOCARBAMOL 500 MG PO TABS: 500.0000 mg | ORAL_TABLET | Freq: Four times a day (QID) | ORAL | 0 refills | Status: AC | PRN

## 2018-04-17 MED ORDER — MEPERIDINE HCL 50 MG/ML IJ SOLN: 6.2500 mg | INTRAMUSCULAR | Status: DC | PRN

## 2018-04-17 MED ORDER — CEFAZOLIN SODIUM-DEXTROSE 2-4 GM/100ML-% IV SOLN
2.0000 g | Freq: Four times a day (QID) | INTRAVENOUS | Status: AC
  Administered 2018-04-17 (×2): 2 g via INTRAVENOUS
  Filled 2018-04-17 (×2): qty 100

## 2018-04-17 SURGICAL SUPPLY — 41 items
BAG DECANTER FOR FLEXI CONT (MISCELLANEOUS) IMPLANT
BAG ZIPLOCK 12X15 (MISCELLANEOUS) IMPLANT
BLADE SAG 18X100X1.27 (BLADE) ×3 IMPLANT
COVER PERINEAL POST (MISCELLANEOUS) ×3 IMPLANT
COVER SURGICAL LIGHT HANDLE (MISCELLANEOUS) ×3 IMPLANT
CUP ACET PINNACLE SECTR 58MM (Hips) ×1 IMPLANT
DERMABOND ADVANCED (GAUZE/BANDAGES/DRESSINGS) ×2
DERMABOND ADVANCED .7 DNX12 (GAUZE/BANDAGES/DRESSINGS) ×1 IMPLANT
DRAPE STERI IOBAN 125X83 (DRAPES) ×3 IMPLANT
DRAPE U-SHAPE 47X51 STRL (DRAPES) ×6 IMPLANT
DRESSING AQUACEL AG SP 3.5X10 (GAUZE/BANDAGES/DRESSINGS) ×1 IMPLANT
DRSG AQUACEL AG ADV 3.5X10 (GAUZE/BANDAGES/DRESSINGS) ×3 IMPLANT
DRSG AQUACEL AG SP 3.5X10 (GAUZE/BANDAGES/DRESSINGS) ×3
DURAPREP 26ML APPLICATOR (WOUND CARE) ×3 IMPLANT
ELECT REM PT RETURN 15FT ADLT (MISCELLANEOUS) ×3 IMPLANT
ELIMINATOR HOLE APEX DEPUY (Hips) ×3 IMPLANT
GLOVE BIOGEL M STRL SZ7.5 (GLOVE) IMPLANT
GLOVE BIOGEL PI IND STRL 7.5 (GLOVE) ×1 IMPLANT
GLOVE BIOGEL PI IND STRL 8.5 (GLOVE) ×1 IMPLANT
GLOVE BIOGEL PI INDICATOR 7.5 (GLOVE) ×2
GLOVE BIOGEL PI INDICATOR 8.5 (GLOVE) ×2
GLOVE ECLIPSE 8.0 STRL XLNG CF (GLOVE) ×6 IMPLANT
GLOVE ORTHO TXT STRL SZ7.5 (GLOVE) ×3 IMPLANT
GOWN STRL REUS W/TWL 2XL LVL3 (GOWN DISPOSABLE) ×3 IMPLANT
GOWN STRL REUS W/TWL LRG LVL3 (GOWN DISPOSABLE) ×3 IMPLANT
HEAD CERAMIC DELTA 36 PLUS 1.5 (Hips) ×3 IMPLANT
HOLDER FOLEY CATH W/STRAP (MISCELLANEOUS) ×3 IMPLANT
LINER NEUTRAL 36X58 PLUS4 ×3 IMPLANT
PACK ANTERIOR HIP CUSTOM (KITS) ×3 IMPLANT
PINNACLE SECTOR CUP 58MM (Hips) ×3 IMPLANT
SCREW 6.5MMX25MM (Screw) ×3 IMPLANT
STEM FEM ACTIS HIGH SZ7 (Stem) ×3 IMPLANT
SUT MNCRL AB 4-0 PS2 18 (SUTURE) ×3 IMPLANT
SUT STRATAFIX 0 PDS 27 VIOLET (SUTURE) ×3
SUT VIC AB 1 CT1 36 (SUTURE) ×9 IMPLANT
SUT VIC AB 2-0 CT1 27 (SUTURE) ×4
SUT VIC AB 2-0 CT1 TAPERPNT 27 (SUTURE) ×2 IMPLANT
SUTURE STRATFX 0 PDS 27 VIOLET (SUTURE) ×1 IMPLANT
TRAY FOLEY MTR SLVR 16FR STAT (SET/KITS/TRAYS/PACK) IMPLANT
WATER STERILE IRR 1000ML POUR (IV SOLUTION) ×3 IMPLANT
YANKAUER SUCT BULB TIP 10FT TU (MISCELLANEOUS) IMPLANT

## 2018-04-17 NOTE — Op Note (Signed)
NAME:  Jose Yang                ACCOUNT NO.: [id number]      MEDICAL RECORD NO.: [id number]      FACILITY:  [facility]      PHYSICIAN:  Shelda Pal  DATE OF BIRTH:  11-15-1950     DATE OF PROCEDURE:  04/17/2018                                 OPERATIVE REPORT         PREOPERATIVE DIAGNOSIS: Right  hip osteoarthritis.      POSTOPERATIVE DIAGNOSIS:  Right hip osteoarthritis.      PROCEDURE:  Right total hip replacement through an anterior approach   utilizing DePuy THR system, component size 58mm pinnacle cup, a size 36+4 neutral   Altrex liner, a size 7 Actis femoral stem with a 36+1.5 delta ceramic   ball.      SURGEON:  Madlyn Frankel. Charlann Boxer, M.D.      ASSISTANT:  Skip Mayer, PA-C     ANESTHESIA:  Spinal.      SPECIMENS:  None.      COMPLICATIONS:  None.      BLOOD LOSS:  500 cc     DRAINS:  None.      INDICATION OF THE PROCEDURE:  Jose Yang is a 67 y.o. male who had   presented to office for evaluation of right hip pain.  Radiographs revealed   progressive degenerative changes with bone-on-bone   articulation of the  hip joint, including subchondral cystic changes and osteophytes.  The patient had painful limited range of   motion significantly affecting their overall quality of life and function.  The patient was failing to    respond to conservative measures including medications and/or injections and activity modification and at this point was ready   to proceed with more definitive measures.  Consent was obtained for   benefit of pain relief.  Specific risks of infection, DVT, component   failure, dislocation, neurovascular injury, and need for revision surgery were reviewed in the office as well discussion of   the anterior versus posterior approach were reviewed.     PROCEDURE IN DETAIL:  The patient was brought to operative theater.   Once adequate anesthesia, preoperative antibiotics, 2 gm of Ancef, 1 gm of Tranexamic Acid, and 10 mg  of Decadron were administered, the patient was positioned supine on the [employer] table.  Once the patient was safely positioned with adequate padding of boney prominences we predraped out the hip, and used fluoroscopy to confirm orientation of the pelvis.      The right hip was then prepped and draped from proximal iliac crest to   mid thigh with a shower curtain technique.      Time-out was performed identifying the patient, planned procedure, and the appropriate extremity.     An incision was then made 2 cm lateral to the   anterior superior iliac spine extending over the orientation of the   tensor fascia lata muscle and sharp dissection was carried down to the   fascia of the muscle.      The fascia was then incised.  The muscle belly was identified and swept   laterally and retractor placed along the superior neck.  Following   cauterization of the circumflex vessels and removing some pericapsular  fat, a second cobra retractor was placed on the inferior neck.  A T-capsulotomy was made along the line of the   superior neck to the trochanteric fossa, then extended proximally and   distally.  Tag sutures were placed and the retractors were then placed   intracapsular.  We then identified the trochanteric fossa and   orientation of my neck cut and then made a neck osteotomy with the femur on traction.  The femoral   head was removed without difficulty or complication.  Traction was let   off and retractors were placed posterior and anterior around the   acetabulum.      The labrum and foveal tissue were debrided.  I began reaming with a 48 mm   reamer and reamed up to 57 mm reamer with good bony bed preparation and a 58 mm  cup was chosen.  The final 58 mm Pinnacle cup was then impacted under fluoroscopy to confirm the depth of penetration and orientation with respect to   Abduction and forward flexion.  A screw was placed into the ilium followed by the hole eliminator.  The final    36+4 neutral Altrex liner was impacted with good visualized rim fit.  The cup was positioned anatomically within the acetabular portion of the pelvis.      At this point, the femur was rolled to 100 degrees.  Further capsule was   released off the inferior aspect of the femoral neck.  I then   released the superior capsule proximally.  With the leg in a neutral position the hook was placed laterally   along the femur under the vastus lateralis origin and elevated manually and then held in position using the hook attachment on the bed.  The leg was then extended and adducted with the leg rolled to 100   degrees of external rotation.  Retractors were placed along the medial calcar and posteriorly over the greater trochanter.  Once the proximal femur was fully   exposed, I used a box osteotome to set orientation.  I then began   broaching with the starting chili pepper broach and passed this by hand and then broached up to 7.  With the 7 broach in place I chose a high offset neck and did several trial reductions.  The offset was appropriate, leg lengths   appeared to be equal best matched with the +1.5 head ball trial confirmed radiographically.   Given these findings, I went ahead and dislocated the hip, repositioned all   retractors and positioned the right hip in the extended and abducted position.  The final 7 Hi Tri Lock stem was   chosen and it was impacted down to the level of neck cut.  Based on this   and the trial reductions, a final 36+1.5 delta ceramic ball was chosen and   impacted onto a clean and dry trunnion, and the hip was reduced.  The   hip had been irrigated throughout the case again at this point.  I did   reapproximate the superior capsular leaflet to the anterior leaflet   using #1 Vicryl.  The fascia of the   tensor fascia lata muscle was then reapproximated using #1 Vicryl and #0 Stratafix sutures.  The   remaining wound was closed with 2-0 Vicryl and running 4-0 Monocryl.    The hip was cleaned, dried, and dressed sterilely using Dermabond and   Aquacel dressing.  The patient was then brought   to recovery room  in stable condition tolerating the procedure well.    Skip Mayer, PA-C was present for the entirety of the case involved from   preoperative positioning, perioperative retractor management, general   facilitation of the case, as well as primary wound closure as assistant.            Madlyn Frankel Charlann Boxer, M.D.        04/17/2018 12:49 PM

## 2018-04-17 NOTE — Anesthesia Postprocedure Evaluation (Signed)
Anesthesia Post Note  Patient: Jose Yang  Procedure(s) Performed: RIGHT TOTAL HIP ARTHROPLASTY ANTERIOR APPROACH (Right Hip)     Patient location during evaluation: PACU Anesthesia Type: Spinal Level of consciousness: oriented and awake and alert Pain management: pain level controlled Vital Signs Assessment: post-procedure vital signs reviewed and stable Respiratory status: spontaneous breathing, respiratory function stable and patient connected to nasal cannula oxygen Cardiovascular status: blood pressure returned to baseline and stable Postop Assessment: no headache, no backache and no apparent nausea or vomiting Anesthetic complications: no    Last Vitals:  Vitals:   04/17/18 1400 04/17/18 1416  BP: 129/71 (!) 143/82  Pulse: 64 63  Resp: 14 16  Temp: 36.9 C 36.8 C  SpO2: 100% 100%    Last Pain:  Vitals:   04/17/18 1400  TempSrc:   PainSc: 5                  Charels Stambaugh

## 2018-04-17 NOTE — Evaluation (Signed)
Physical Therapy Evaluation Patient Details Name: Jose Yang MRN: [id number] DOB: 1951-07-25 Today's Date: 04/17/2018   History of Present Illness  pt s/p RDA THA 04/17/2018. , with h/o R RTC repair as well.   Clinical Impression  Pt is s/p THA resulting in the deficits listed below (see PT Problem List). Pt will benefit from acute PT to increase their independence and safety with mobility to allow discharge to the venue listed below. Eval limited due to spinal still had not worn off for safe ambulation. Due stand and transfer to the chair.      Follow Up Recommendations No PT follow up    Equipment Recommendations  Rolling walker with 5" wheels(MD not states pt has equipment , however has old standard walker and will need RW )    Recommendations for Other Services       Precautions / Restrictions Precautions Precautions: None Precaution Comments: direct anterior with no hip precautions  Restrictions RLE Weight Bearing: Weight bearing as tolerated      Mobility  Bed Mobility Overal bed mobility: Needs Assistance Bed Mobility: Supine to Sit;Sit to Supine     Supine to sit: Min assist     General bed mobility comments: asssit with LE and cues for safety and movement   Transfers Overall transfer level: Needs assistance Equipment used: Rolling walker (2 wheeled) Transfers: Sit to/from Stand Sit to Stand: Min assist;+2 safety/equipment         General transfer comment: stood and mostly pivoted on LLE due to RLE still with numbness and difficulty moving due to spinal block not worn off yet.   Ambulation/Gait                Stairs            Wheelchair Mobility    Modified Rankin (Stroke Patients Only)       Balance                                             Pertinent Vitals/Pain Pain Assessment: 0-10 Pain Score: 5  Pain Location: R hip with movement and spinal block is not completely worn off yet either  Pain Descriptors /  Indicators: Pressure;Aching Pain Intervention(s): Limited activity within patient's tolerance;Monitored during session;Ice applied(noticed slight swelling on lateral quad muscle as well. )    Home Living Family/patient expects to be discharged to:: Private residence Living Arrangements: Spouse/significant other Available Help at Discharge: Family Type of Home: House Home Access: Stairs to enter Entrance Stairs-Rails: None Secretary/administrator of Steps: 1 Home Layout: One level Home Equipment: Environmental consultant - standard      Prior Function Level of Independence: Independent               Hand Dominance        Extremity/Trunk Assessment        Lower Extremity Assessment Lower Extremity Assessment: Overall WFL for tasks assessed(spinal still not worn off on Bil gluteals and Righ hip flexors and quads .)       Communication   Communication: No difficulties  Cognition Arousal/Alertness: Awake/alert Behavior During Therapy: WFL for tasks assessed/performed Overall Cognitive Status: Within Functional Limits for tasks assessed  General Comments      Exercises Total Joint Exercises Ankle Circles/Pumps: AROM;10 reps;Both Quad Sets: AAROM;10 reps;Right Heel Slides: AAROM;5 reps;Right   Assessment/Plan    PT Assessment Patient needs continued PT services  PT Problem List Decreased strength;Decreased activity tolerance;Decreased mobility       PT Treatment Interventions Gait training;DME instruction;Functional mobility training;Therapeutic activities;Stair training;Therapeutic exercise;Patient/family education    PT Goals (Current goals can be found in the Care Plan section)  Acute Rehab PT Goals Patient Stated Goal: I want to be ale to move with less pain  PT Goal Formulation: With patient/family Time For Goal Achievement: 05/01/18 Potential to Achieve Goals: Good    Frequency 7X/week   Barriers to discharge         Co-evaluation               AM-PAC PT "6 Clicks" Daily Activity  Outcome Measure Difficulty turning over in bed (including adjusting bedclothes, sheets and blankets)?: Unable Difficulty moving from lying on back to sitting on the side of the bed? : Unable Difficulty sitting down on and standing up from a chair with arms (e.g., wheelchair, bedside commode, etc,.)?: Unable Help needed moving to and from a bed to chair (including a wheelchair)?: A Little Help needed walking in hospital room?: Total Help needed climbing 3-5 steps with a railing? : Total 6 Click Score: 8    End of Session Equipment Utilized During Treatment: Gait belt Activity Tolerance: Patient tolerated treatment well;Treatment limited secondary to medical complications (Comment)(limited due to spinal block not worn off yet) Patient left: in chair;with chair alarm set;with family/visitor present Nurse Communication: Mobility status PT Visit Diagnosis: Other abnormalities of gait and mobility (R26.89)    Time: [id number] PT Time Calculation (min) (ACUTE ONLY): 19 min   Charges:   PT Evaluation $PT Eval Low Complexity: 1 Low          Marella BileSharron Yessenia Maillet, PT Pager: [phone] 04/17/2018   Arianie Couse, Clois DupesSHARRON 04/17/2018, 8:47 PM

## 2018-04-17 NOTE — Anesthesia Procedure Notes (Signed)
Spinal  Patient location during procedure: OR Start time: 04/17/2018 11:24 AM End time: 04/17/2018 11:27 AM Staffing Anesthesiologist: Bethena Midgetddono, Vera Wishart, MD Preanesthetic Checklist Completed: patient identified, site marked, surgical consent, pre-op evaluation, timeout performed, IV checked, risks and benefits discussed and monitors and equipment checked Spinal Block Patient position: sitting Prep: DuraPrep Patient monitoring: heart rate, cardiac monitor, continuous pulse ox and blood pressure Approach: midline Location: L3-4 Injection technique: single-shot Needle Needle type: Sprotte  Needle gauge: 24 G Needle length: 9 cm Assessment Sensory level: T4

## 2018-04-17 NOTE — Interval H&P Note (Signed)
History and Physical Interval Note:  04/17/2018 10:00 AM  Jose Yang  has presented today for surgery, with the diagnosis of Right hip osteoarthritis  The various methods of treatment have been discussed with the patient and family. After consideration of risks, benefits and other options for treatment, the patient has consented to  Procedure(s) with comments: RIGHT TOTAL HIP ARTHROPLASTY ANTERIOR APPROACH (Right) - 70 mins as a surgical intervention .  The patient's history has been reviewed, patient examined, no change in status, stable for surgery.  I have reviewed the patient's chart and labs.  Questions were answered to the patient's satisfaction.     Shelda PalMatthew D Clements Toro

## 2018-04-17 NOTE — Discharge Instructions (Signed)

## 2018-04-17 NOTE — Transfer of Care (Signed)
Immediate Anesthesia Transfer of Care Note  Patient: Jose Yang  Procedure(s) Performed: RIGHT TOTAL HIP ARTHROPLASTY ANTERIOR APPROACH (Right Hip)  Patient Location: PACU  Anesthesia Type:Spinal  Level of Consciousness: awake, alert , oriented and patient cooperative  Airway & Oxygen Therapy: Patient connected to face mask oxygen  Post-op Assessment: Report given to RN and Post -op Vital signs reviewed and stable  Post vital signs: stable  Last Vitals:  Vitals Value Taken Time  BP    Temp    Pulse 68 04/17/2018  1:27 PM  Resp 17 04/17/2018  1:27 PM  SpO2 100 % 04/17/2018  1:27 PM  Vitals shown include unvalidated device data.  Last Pain:  Vitals:   04/17/18 0927  TempSrc:   PainSc: 0-No pain         Complications: No apparent anesthesia complications

## 2018-04-17 NOTE — Anesthesia Preprocedure Evaluation (Signed)
Anesthesia Evaluation  Patient identified by MRN, date of birth, ID band Patient awake  General Assessment Comment:confusion for a couple weeks after anesthesia  Reviewed: Allergy & Precautions, H&P , NPO status , Patient's Chart, lab work & pertinent test results, reviewed documented beta blocker date and time   History of Anesthesia Complications (+) Family history of anesthesia reaction and history of anesthetic complications  Airway Mallampati: II  TM Distance: >3 FB Neck ROM: full    Dental no notable dental hx.    Pulmonary neg pulmonary ROS,    Pulmonary exam normal breath sounds clear to auscultation       Cardiovascular Exercise Tolerance: Good hypertension, negative cardio ROS   Rhythm:regular Rate:Normal     Neuro/Psych Anxiety Depression negative neurological ROS     GI/Hepatic Neg liver ROS, GERD  ,  Endo/Other  diabetes, Type 2  Renal/GU negative Renal ROS     Musculoskeletal  (+) Arthritis ,   Abdominal   Peds  Hematology   Anesthesia Other Findings   Reproductive/Obstetrics negative OB ROS                             Anesthesia Physical Anesthesia Plan  ASA: III  Anesthesia Plan: Spinal   Post-op Pain Management:    Induction:   PONV Risk Score and Plan: 1 and Treatment may vary due to age or medical condition  Airway Management Planned: Nasal Cannula, Natural Airway and Mask  Additional Equipment:   Intra-op Plan:   Post-operative Plan:   Informed Consent: I have reviewed the patients History and Physical, chart, labs and discussed the procedure including the risks, benefits and alternatives for the proposed anesthesia with the patient or authorized representative who has indicated his/her understanding and acceptance.   Dental Advisory Given  Plan Discussed with: CRNA, Anesthesiologist and Surgeon  Anesthesia Plan Comments: (  )         Anesthesia Quick Evaluation

## 2018-04-18 ENCOUNTER — Encounter (HOSPITAL_COMMUNITY): Payer: Self-pay | Admitting: Orthopedic Surgery

## 2018-04-18 DIAGNOSIS — E669 Obesity, unspecified: Secondary | ICD-10-CM | POA: Diagnosis present

## 2018-04-18 LAB — GLUCOSE, CAPILLARY
Glucose-Capillary: 155 mg/dL — ABNORMAL HIGH (ref 70–99)
Glucose-Capillary: 258 mg/dL — ABNORMAL HIGH (ref 70–99)

## 2018-04-18 LAB — BASIC METABOLIC PANEL
Anion gap: 8 (ref 5–15)
BUN: 17 mg/dL (ref 8–23)
CALCIUM: 8.8 mg/dL — AB (ref 8.9–10.3)
CO2: 27 mmol/L (ref 22–32)
Chloride: 104 mmol/L (ref 98–111)
Creatinine, Ser: 1.07 mg/dL (ref 0.61–1.24)
GFR calc Af Amer: >60 mL/min (ref >60)
GLUCOSE: 182 mg/dL — AB (ref 70–99)
Potassium: 4.8 mmol/L (ref 3.5–5.1)
Sodium: 139 mmol/L (ref 135–145)

## 2018-04-18 LAB — CBC
HCT: 35.9 % — ABNORMAL LOW (ref 39.0–52.0)
Hemoglobin: 12 g/dL — ABNORMAL LOW (ref 13.0–17.0)
MCH: 29.7 pg (ref 26.0–34.0)
MCHC: 33.4 g/dL (ref 30.0–36.0)
MCV: 88.9 fL (ref 78.0–100.0)
PLATELETS: 109 10*3/uL — AB (ref 150–400)
RBC: 4.04 MIL/uL — ABNORMAL LOW (ref 4.22–5.81)
RDW: 13.3 % (ref 11.5–15.5)
WBC: 8.1 10*3/uL (ref 4.0–10.5)

## 2018-04-18 NOTE — Progress Notes (Signed)
Physical Therapy Treatment Patient Details Name: Jose Yang MRN: [id number] DOB: 1951-05-30 Today's Date: 04/18/2018    History of Present Illness pt s/p RDA THA 04/17/2018. , with h/o R RTC repair as well.     PT Comments    POD # 1 pm session with spouse present for education on all listed below.  Assisted with transfers, gait, one step and HEP.  Instructed on use of ICE.  Pt ready for D/C to home.   Follow Up Recommendations  No PT follow up(HEP)     Equipment Recommendations  Rolling walker with 5" wheels    Recommendations for Other Services       Precautions / Restrictions Precautions Precautions: None Precaution Comments: direct anterior with no hip precautions  Restrictions Weight Bearing Restrictions: No RLE Weight Bearing: Weight bearing as tolerated    Mobility  Bed Mobility Overal bed mobility: Needs Assistance Bed Mobility: Supine to Sit     Supine to sit: Min guard;Min assist     General bed mobility comments: OOB in recliner   Transfers Overall transfer level: Needs assistance Equipment used: Rolling walker (2 wheeled) Transfers: Sit to/from Stand Sit to Stand: Min guard;Min assist         General transfer comment: 25% VC's on hand placement and safety with turns using walker   Ambulation/Gait Ambulation/Gait assistance: Min guard;Supervision Gait Distance (Feet): 75 Feet Assistive device: Rolling walker (2 wheeled) Gait Pattern/deviations: Step-to pattern Gait velocity: decreased   General Gait Details: 50% VC's on proper sequencing, walker to self distance and safety with turns with spouse present for education on safety    Stairs Stairs: Yes Stairs assistance: Supervision;Min guard Stair Management: No rails;Step to pattern;Forwards;With walker Number of Stairs: 1 General stair comments: with spouse present for "hands on" instruction on proper/safe tech to navigate one step into house    Wheelchair Mobility    Modified Rankin  (Stroke Patients Only)       Balance                                            Cognition Arousal/Alertness: Awake/alert Behavior During Therapy: WFL for tasks assessed/performed Overall Cognitive Status: Within Functional Limits for tasks assessed                                        Exercises  10 reps all standing TE's following HEP handout Instructed on proper tech, freq and use of ICE.     General Comments        Pertinent Vitals/Pain Pain Assessment: 0-10 Pain Score: 6  Pain Location: R hip Pain Descriptors / Indicators: Tightness;Tender Pain Intervention(s): Monitored during session;Patient requesting pain meds-RN notified;Repositioned;Ice applied    Home Living                      Prior Function            PT Goals (current goals can now be found in the care plan section) Progress towards PT goals: Progressing toward goals    Frequency    7X/week      PT Plan Current plan remains appropriate    Co-evaluation              AM-PAC PT "6 Clicks" Daily Activity  Outcome Measure  Difficulty turning over in bed (including adjusting bedclothes, sheets and blankets)?: A Little Difficulty moving from lying on back to sitting on the side of the bed? : A Little Difficulty sitting down on and standing up from a chair with arms (e.g., wheelchair, bedside commode, etc,.)?: A Little Help needed moving to and from a bed to chair (including a wheelchair)?: A Little Help needed walking in hospital room?: A Little Help needed climbing 3-5 steps with a railing? : A Lot 6 Click Score: 17    End of Session Equipment Utilized During Treatment: Gait belt Activity Tolerance: Patient tolerated treatment well;Treatment limited secondary to medical complications (Comment) Patient left: in chair;with chair alarm set;with family/visitor present Nurse Communication: (pt ready for D/C to home) PT Visit Diagnosis: Other  abnormalities of gait and mobility (R26.89)     Time: [id number] PT Time Calculation (min) (ACUTE ONLY): 28 min  Charges:  $Gait Training: 8-22 mins $Therapeutic Activity: 8-22 mins                     Felecia ShellingLori Melda Mermelstein  PTA [facility]  Acute  Rehab Pager      [phone]

## 2018-04-18 NOTE — Discharge Summary (Signed)
Physician Discharge Summary  Patient ID: Jose Yang MRN: [id number] DOB/AGE: [birth year] 67 y.o.  Admit date: 04/17/2018 Discharge date:  04/18/2018  Procedures:  Procedure(s) (LRB): RIGHT TOTAL HIP ARTHROPLASTY ANTERIOR APPROACH (Right)  Attending Physician:  Dr. Durene Romans   Admission Diagnoses:   Right hip primary OA / pain  Discharge Diagnoses:  Principal Problem:   S/P right THA, AA Active Problems:   Obese  Past Medical History:  Diagnosis Date  . Anxiety   . Arthritis   . Complication of anesthesia    confusion for a couple weeks after anesthesia  . Depression   . Diabetes mellitus without complication (HCC)    type 2  . Family history of adverse reaction to anesthesia   . GERD (gastroesophageal reflux disease)   . Hypertension   . Pneumonia    as a   small child     HPI:    Jose Yang, 67 y.o. male, has a history of pain and functional disability in the right hip(s) due to arthritis and patient has failed non-surgical conservative treatments for greater than 12 weeks to include NSAID's and/or analgesics, corticosteriod injections and activity modification.  Onset of symptoms was gradual starting 3 years ago with gradually worsening course since that time.The patient noted no past surgery on the right hip(s).  Patient currently rates pain in the right hip at 10 out of 10 with activity. Patient has night pain, worsening of pain with activity and weight bearing, trendelenberg gait, pain that interfers with activities of daily living and pain with passive range of motion. Patient has evidence of periarticular osteophytes and joint space narrowing by imaging studies. This condition presents safety issues increasing the risk of falls. There is no current active infection.  Risks, benefits and expectations were discussed with the patient.  Risks including but not limited to the risk of anesthesia, blood clots, nerve damage, blood vessel damage, failure of the prosthesis,  infection and up to and including death.  Patient understand the risks, benefits and expectations and wishes to proceed with surgery.   PCP: System, Pcp Not In   Discharged Condition: good  Hospital Course:  Patient underwent the above stated procedure on 04/17/2018. Patient tolerated the procedure well and brought to the recovery room in good condition and subsequently to the floor.  POD #1 BP: 117/69 ; Pulse: 69 ; Temp: 97.9 F (36.9 C) ; Resp: 14 Patient reports pain as mild, pain controlled. No events throughout the night. Looking forward to progressing in his recovery of the right hip.  Ready to be discharged home. Dorsiflexion/plantar flexion intact, incision: dressing C/D/I, no cellulitis present and compartment soft.   LABS  Basename    HGB     12.0  HCT     35.9    Discharge Exam: General appearance: alert, cooperative and no distress Extremities: Homans sign is negative, no sign of DVT, no edema, redness or tenderness in the calves or thighs and no ulcers, gangrene or trophic changes  Disposition: Home with follow up in 2 weeks   Follow-up Information    Durene Romans, MD. Schedule an appointment as soon as possible for a visit in 2 weeks.   Specialty:  Orthopedic Surgery Contact information: [address] [place] [address] [phone]           Discharge Instructions    Call MD / Call 911   Complete by:  As directed    If you experience chest pain or shortness  of breath, CALL 911 and be transported to the hospital emergency room.  If you develope a fever above 101 F, pus (white drainage) or increased drainage or redness at the wound, or calf pain, call your surgeon's office.   Change dressing   Complete by:  As directed    Maintain surgical dressing until follow up in the clinic. If the edges start to pull up, may reinforce with tape. If the dressing is no longer working, may remove and cover with gauze and tape, but must keep the area dry  and clean.  Call with any questions or concerns.   Constipation Prevention   Complete by:  As directed    Drink plenty of fluids.  Prune juice may be helpful.  You may use a stool softener, such as Colace (over the counter) 100 mg twice a day.  Use MiraLax (over the counter) for constipation as needed.   Diet - low sodium heart healthy   Complete by:  As directed    Discharge instructions   Complete by:  As directed    Maintain surgical dressing until follow up in the clinic. If the edges start to pull up, may reinforce with tape. If the dressing is no longer working, may remove and cover with gauze and tape, but must keep the area dry and clean.  Follow up in 2 weeks at [facility]. Call with any questions or concerns.   Increase activity slowly as tolerated   Complete by:  As directed    Weight bearing as tolerated with assist device (walker, cane, etc) as directed, use it as long as suggested by your surgeon or therapist, typically at least 4-6 weeks.   TED hose   Complete by:  As directed    Use stockings (TED hose) for 2 weeks on both leg(s).  You may remove them at night for sleeping.      Allergies as of 04/18/2018   No Known Allergies     Medication List    STOP taking these medications   ibuprofen 200 MG tablet Commonly known as:  ADVIL,MOTRIN     TAKE these medications   aspirin 81 MG chewable tablet Commonly known as:  ASPIRIN CHILDRENS Chew 1 tablet (81 mg total) by mouth 2 (two) times daily. Take for 4 weeks, then resume regular dose.   benazepril 40 MG tablet Commonly known as:  LOTENSIN Take 40 mg by mouth daily with breakfast.   citalopram 40 MG tablet Commonly known as:  CELEXA Take 40 mg by mouth daily with breakfast.   DEXILANT 60 MG capsule Generic drug:  dexlansoprazole Take 60 mg by mouth daily before breakfast.   docusate sodium 100 MG capsule Commonly known as:  COLACE Take 1 capsule (100 mg total) by mouth 2 (two) times daily.     ferrous sulfate 325 (65 FE) MG tablet Commonly known as:  FERROUSUL Take 1 tablet (325 mg total) by mouth 3 (three) times daily with meals.   glimepiride 4 MG tablet Commonly known as:  AMARYL Take 2 mg by mouth 2 (two) times daily. Breakfast & supper   HYDROcodone-acetaminophen 7.5-325 MG tablet Commonly known as:  NORCO Take 1-2 tablets by mouth every 4 (four) hours as needed for moderate pain.   metFORMIN 1000 MG tablet Commonly known as:  GLUCOPHAGE Take 1,000 mg by mouth 2 (two) times daily.   methocarbamol 500 MG tablet Commonly known as:  ROBAXIN Take 1 tablet (500 mg total) by mouth every 6 (six)  hours as needed for muscle spasms.   polyethylene glycol packet Commonly known as:  MIRALAX / GLYCOLAX Take 17 g by mouth 2 (two) times daily.   rosuvastatin 40 MG tablet Commonly known as:  CRESTOR Take 40 mg by mouth at bedtime.   vitamin B-12 1000 MCG tablet Commonly known as:  CYANOCOBALAMIN Take 1,000 mcg by mouth 2 (two) times a week.            Durable Medical Equipment  (From admission, onward)        Start     Ordered   04/18/18 0820  For home use only DME Walker rolling  Once    Question:  Patient needs a walker to treat with the following condition  Answer:  History of orthopedic surgery   04/18/18 0819       Discharge Care Instructions  (From admission, onward)        Start     Ordered   04/18/18 0000  Change dressing    Comments:  Maintain surgical dressing until follow up in the clinic. If the edges start to pull up, may reinforce with tape. If the dressing is no longer working, may remove and cover with gauze and tape, but must keep the area dry and clean.  Call with any questions or concerns.   04/18/18 0825       Signed: Anastasio AuerbachMatthew S. Rosendo Couser   PA-C  04/18/2018, 8:25 AM

## 2018-04-18 NOTE — Progress Notes (Signed)
Physical Therapy Treatment Patient Details Name: Jose PortelaVictor Yang MRN: [id number] DOB: 11-10-1950 Today's Date: 04/18/2018    History of Present Illness pt s/p RDA THA 04/17/2018. , with h/o R RTC repair as well.     PT Comments    POD # 1 am session Assisted OOB to amb in hallway.  50% VC's on proper sequencing, walker to self distance and safety with turns.  Returned to room and performed some THR TE's following HEP handout.  Instructed on proper tech, freq as well as use of ICE.  Pt will need another PT session this afternoon with spouse present.     Follow Up Recommendations  No PT follow up(HEP)     Equipment Recommendations  Rolling walker with 5" wheels    Recommendations for Other Services       Precautions / Restrictions Precautions Precautions: None Precaution Comments: direct anterior with no hip precautions  Restrictions Weight Bearing Restrictions: No RLE Weight Bearing: Weight bearing as tolerated    Mobility  Bed Mobility Overal bed mobility: Needs Assistance Bed Mobility: Supine to Sit     Supine to sit: Min guard;Min assist     General bed mobility comments: demonstarted how to use a belt to self assist R LE  Transfers Overall transfer level: Needs assistance Equipment used: Rolling walker (2 wheeled) Transfers: Sit to/from Stand Sit to Stand: Min guard;Min assist         General transfer comment: 25% VC's on hand placement and safety with turns using walker   Ambulation/Gait Ambulation/Gait assistance: Min guard;Supervision Gait Distance (Feet): 55 Feet Assistive device: Rolling walker (2 wheeled) Gait Pattern/deviations: Step-to pattern Gait velocity: decreased   General Gait Details: 50% VC's on proper sequencing, walker to self distance and safety with turns   Social research officer, governmenttairs             Wheelchair Mobility    Modified Rankin (Stroke Patients Only)       Balance                                             Cognition Arousal/Alertness: Awake/alert Behavior During Therapy: WFL for tasks assessed/performed Overall Cognitive Status: Within Functional Limits for tasks assessed                                        Exercises   Total Hip Replacement TE's 10 reps ankle pumps 10 reps knee presses 10 reps heel slides 10 reps SAQ's 10 reps ABD Followed by ICE     General Comments        Pertinent Vitals/Pain Pain Assessment: 0-10 Pain Score: 6  Pain Location: R hip Pain Descriptors / Indicators: Tightness;Tender Pain Intervention(s): Monitored during session;Patient requesting pain meds-RN notified;Repositioned;Ice applied    Home Living                      Prior Function            PT Goals (current goals can now be found in the care plan section) Progress towards PT goals: Progressing toward goals    Frequency           PT Plan Current plan remains appropriate    Co-evaluation  AM-PAC PT "6 Clicks" Daily Activity  Outcome Measure  Difficulty turning over in bed (including adjusting bedclothes, sheets and blankets)?: A Lot Difficulty moving from lying on back to sitting on the side of the bed? : A Lot Difficulty sitting down on and standing up from a chair with arms (e.g., wheelchair, bedside commode, etc,.)?: A Lot Help needed moving to and from a bed to chair (including a wheelchair)?: A Lot Help needed walking in hospital room?: A Lot Help needed climbing 3-5 steps with a railing? : A Lot 6 Click Score: 12    End of Session Equipment Utilized During Treatment: Gait belt Activity Tolerance: Patient tolerated treatment well;Treatment limited secondary to medical complications (Comment) Patient left: in chair;with chair alarm set;with family/visitor present Nurse Communication: Mobility status       Time: 1610-9604 PT Time Calculation (min) (ACUTE ONLY): 42 min  Charges:  $Gait Training: 8-22 mins $Therapeutic  Exercise: 8-22 mins $Therapeutic Activity: 8-22 mins                     {Anabell Swint  PTA [facility]  Acute  Rehab Pager      [phone]

## 2018-04-18 NOTE — Progress Notes (Signed)
     Subjective: 1 Day Post-Op Procedure(s) (LRB): RIGHT TOTAL HIP ARTHROPLASTY ANTERIOR APPROACH (Right)    Seen by Dr. Charlann Boxerlin. Patient reports pain as mild, pain controlled. No events throughout the night. Looking forward to progressing in his recovery of the right hip.  Ready to be discharged home.   Objective:   VITALS:   Vitals:   04/18/18 0104 04/18/18 0500  BP: 140/86 117/69  Pulse: 81 69  Resp: 18 14  Temp: 97.8 F (36.6 C) 97.9 F (36.6 C)  SpO2: 98% 100%    Dorsiflexion/Plantar flexion intact Incision: dressing C/D/I No cellulitis present Compartment soft  LABS Recent Labs    04/18/18 0524  HGB 12.0*  HCT 35.9*  WBC 8.1  PLT 109*    Recent Labs    04/18/18 0524  NA 139  K 4.8  BUN 17  CREATININE 1.07  GLUCOSE 182*     Assessment/Plan: 1 Day Post-Op Procedure(s) (LRB): RIGHT TOTAL HIP ARTHROPLASTY ANTERIOR APPROACH (Right) Foley cath d/c'ed Advance diet Up with therapy D/C IV fluids Discharge home [facility] Follow up with OLIN,Liberty Stead D in 2 weeks.  Contact information:  [facility]) [address] [place] [id number] [phone]     Obese (BMI 30-39.9) Estimated body mass index is 32.25 kg/m as calculated from the following:   Height as of this encounter: 5' 10.5" (1.791 m).   Weight as of this encounter: 103.4 kg (228 lb). Patient also counseled that weight may inhibit the healing process Patient counseled that losing weight will help with future health issues      Jose AuerbachMatthew S. Momoka Yang   PAC  04/18/2018, 8:22 AM

## 2018-04-18 NOTE — Progress Notes (Signed)
Patient discharged to home with wife. Given all belongings, instructions, prescriptions, equipment. Wife present for all instructions. Escorted to pov via w/c. Encouraged to ambulate every hour and do ankle pumps during drive home.

## 2020-01-02 IMAGING — DX DG PORTABLE PELVIS
1 series · 1 of 1 positions shown · non-contrast
Comparison: Intraoperative x-rays from same day.

CLINICAL DATA: Right hip replacement.

EXAM:
PORTABLE PELVIS 1-2 VIEWS

[pelvis ap]
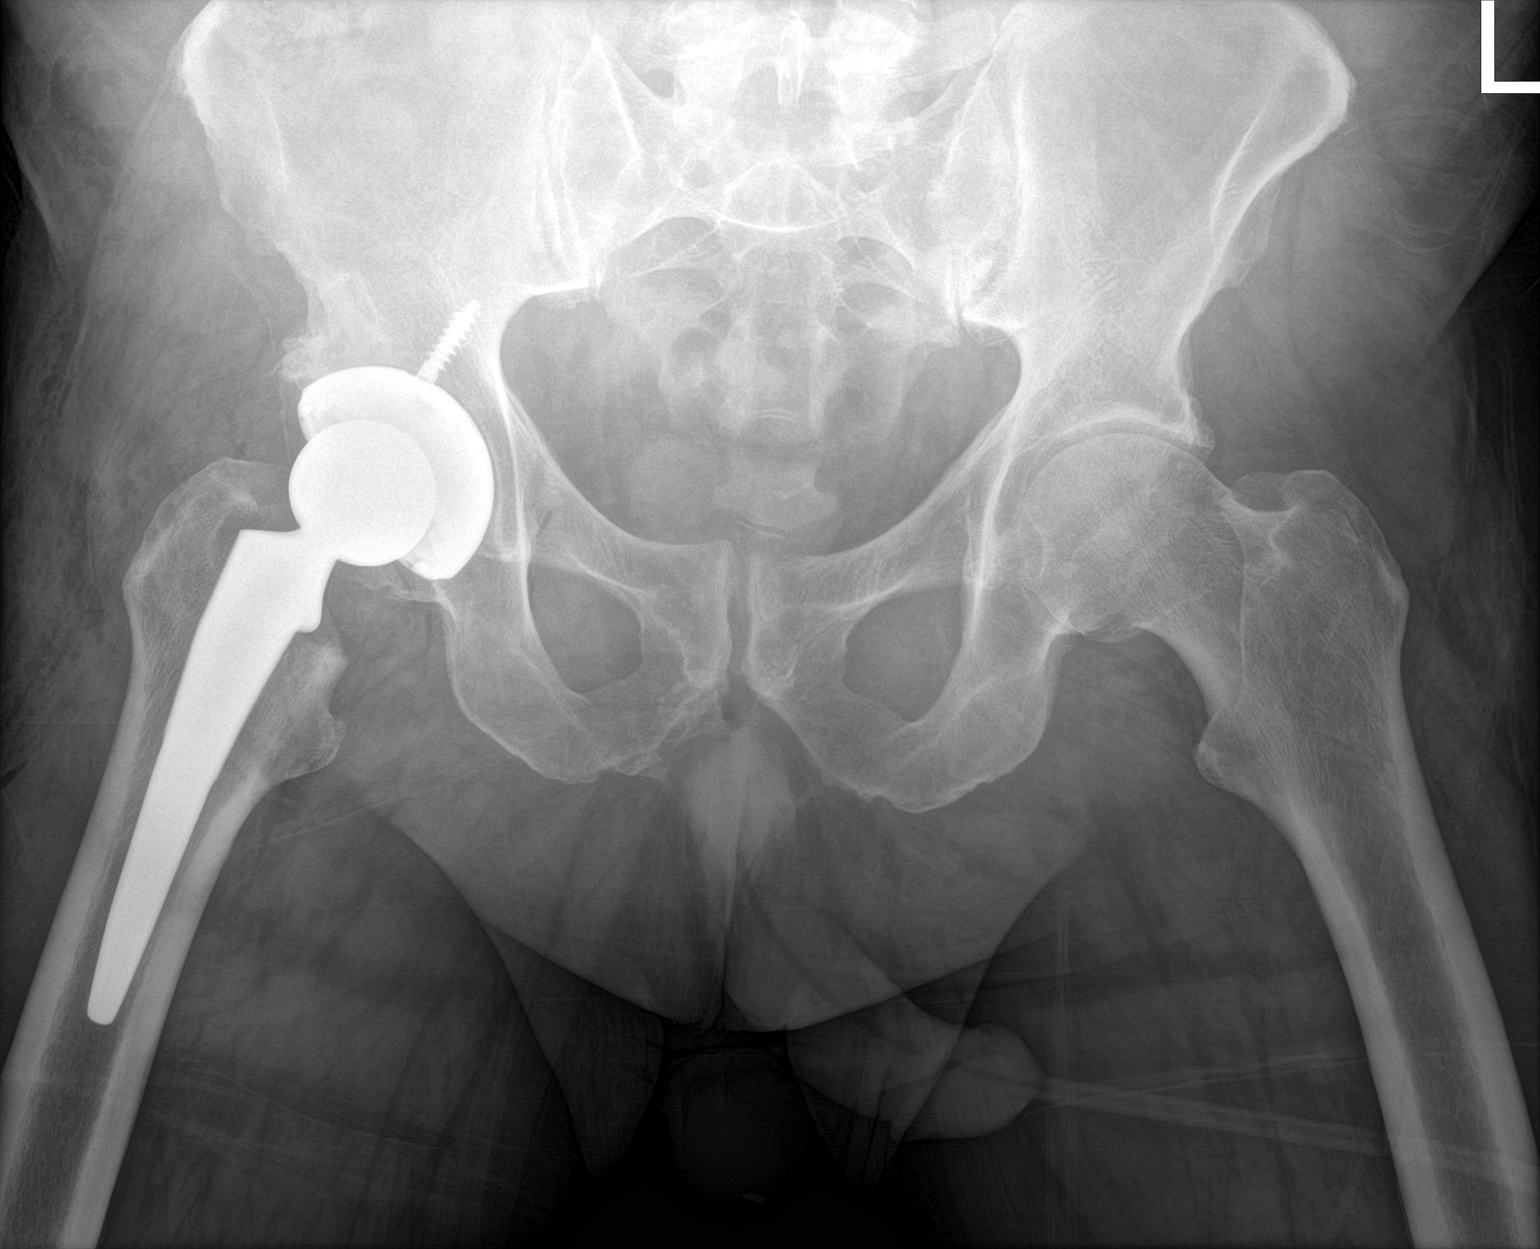

[1 of 1 positions shown; findings below may reference images not displayed]

FINDINGS: The right hip demonstrates a total arthroplasty without evidence of
hardware failure or complication. There is expected intra-articular
air. There is no fracture or dislocation. The alignment is anatomic.
Post-surgical changes noted in the surrounding soft tissues.
IMPRESSION: 1. Interval right total hip arthroplasty without evidence of acute
postoperative complication.

## 2022-03-08 ENCOUNTER — Other Ambulatory Visit (HOSPITAL_COMMUNITY): Payer: Self-pay | Admitting: Family Medicine
